# Patient Record
Sex: Male | Born: 1990 | Race: Black or African American | Hispanic: No | Marital: Single
Health system: Midwestern US, Community
[De-identification: ages and names within clinical notes are randomized; demographics above are authoritative.]

## PROBLEM LIST (undated history)

## (undated) DIAGNOSIS — J45909 Unspecified asthma, uncomplicated: Secondary | ICD-10-CM

---

## 2012-12-01 ENCOUNTER — Emergency Department (HOSPITAL_BASED_OUTPATIENT_CLINIC_OR_DEPARTMENT_OTHER)
Admission: EM | Admit: 2012-12-01 | Discharge: 2012-12-01 | Disposition: A | Discharge status: Home / Self Care | Payer: Medicaid Other | Attending: Emergency Medicine | Admitting: Emergency Medicine

## 2012-12-01 ENCOUNTER — Emergency Department (HOSPITAL_BASED_OUTPATIENT_CLINIC_OR_DEPARTMENT_OTHER): Payer: Medicaid Other

## 2012-12-01 ENCOUNTER — Encounter (HOSPITAL_BASED_OUTPATIENT_CLINIC_OR_DEPARTMENT_OTHER): Payer: Self-pay | Admitting: Emergency Medicine

## 2012-12-01 DIAGNOSIS — K219 Gastro-esophageal reflux disease without esophagitis: Secondary | ICD-10-CM | POA: Insufficient documentation

## 2012-12-01 DIAGNOSIS — R110 Nausea: Secondary | ICD-10-CM | POA: Insufficient documentation

## 2012-12-01 DIAGNOSIS — R0789 Other chest pain: Secondary | ICD-10-CM | POA: Insufficient documentation

## 2012-12-01 DIAGNOSIS — R079 Chest pain, unspecified: Secondary | ICD-10-CM

## 2012-12-01 DIAGNOSIS — R0609 Other forms of dyspnea: Secondary | ICD-10-CM | POA: Insufficient documentation

## 2012-12-01 DIAGNOSIS — R0989 Other specified symptoms and signs involving the circulatory and respiratory systems: Secondary | ICD-10-CM | POA: Insufficient documentation

## 2012-12-01 DIAGNOSIS — R1310 Dysphagia, unspecified: Secondary | ICD-10-CM | POA: Insufficient documentation

## 2012-12-01 DIAGNOSIS — R51 Headache: Secondary | ICD-10-CM | POA: Insufficient documentation

## 2012-12-01 MED ORDER — RANITIDINE HCL 150 MG PO CAPS: 150.0000 mg | ORAL_CAPSULE | Freq: Every day | ORAL | Status: AC

## 2012-12-01 NOTE — ED Notes (Signed)
MD at bedside. 

## 2012-12-01 NOTE — ED Provider Notes (Signed)
CSN: [id number]     Arrival date & time 12/01/12  1731 History   This chart was scribed for Ethelda Chick, MD by Joaquin Music, ED Scribe. This patient was seen in room MH07/MH07 and the patient's care was started at [facility] PM  Chief Complaint  Patient presents with  . Chest Pain   Patient is a 22 y.o. male presenting with chest pain. The history is provided by the patient. No language interpreter was used.  Chest Pain Pain location:  L chest Pain quality: aching, throbbing and tightness   Pain radiates to:  Does not radiate Pain radiates to the back: no   Pain severity:  Moderate Onset quality:  Sudden Timing:  Sporadic Progression:  Unchanged Chronicity:  New Relieved by:  Nothing Worsened by:  Nothing tried Ineffective treatments:  None tried Associated symptoms: headache   Associated symptoms: no abdominal pain, no back pain, no cough, no fever, no nausea, no shortness of breath, not vomiting and no weakness   Risk factors: no birth control, no diabetes mellitus, no high cholesterol, no hypertension, not pregnant, no prior DVT/PE and no surgery    HPI Comments: Milton Mcginty is a 22 y.o. male who presents to the Emergency Department complaining of intermittent worsening L sided chest pain with associated HA that began about 2 weeks ago. Pt states he feels "something stuck in his throat" and complicates his breathing and swallowing. He states drinking water improves the pain but states any other fluids do not improve it. He reports having to spit frequently and states the sputum is generally thick. He describes his chest pain as "something really weird". He states he was recently seen at [facility] in [place], [place] and states all labs were WNL. Pt states prior to CP, he was congested and sneezing frequently. Pt denies having a PCP. Pt denies having fevers.  He states he recently moved to [place] 2 weeks ago.  History reviewed. No pertinent past medical  history. History reviewed. No pertinent past surgical history. History reviewed. No pertinent family history. History  Substance Use Topics  . Smoking status: Never Smoker   . Smokeless tobacco: Not on file  . Alcohol Use: No    Review of Systems  Constitutional: Negative for fever.  Respiratory: Negative for cough and shortness of breath.   Cardiovascular: Positive for chest pain.  Gastrointestinal: Negative for nausea, vomiting and abdominal pain.  Musculoskeletal: Negative for back pain.  Neurological: Positive for headaches. Negative for weakness.  All other systems reviewed and are negative.   Allergies  Review of patient's allergies indicates no known allergies.  Home Medications   Current Outpatient Rx  Name  Route  Sig  Dispense  Refill  . ranitidine (ZANTAC) 150 MG capsule   Oral   Take 1 capsule (150 mg total) by mouth daily.   30 capsule   0     Triage Vitals:BP 133/69  Pulse 82  Temp(Src) 98.9 F (37.2 C) (Oral)  Resp 18  Ht 6' (1.829 m)  Wt 160 lb (72.576 kg)  BMI 21.70 kg/m2  SpO2 100%  Physical Exam  Nursing note and vitals reviewed. Constitutional: He is oriented to person, place, and time. He appears well-developed and well-nourished. No distress.  HENT:  Head: Normocephalic and atraumatic.  Eyes: EOM are normal. Pupils are equal, round, and reactive to light.  Neck: Neck supple. No tracheal deviation present.  Cardiovascular: Normal rate, regular rhythm and normal heart sounds.   Pulmonary/Chest: Effort normal and  breath sounds normal. No respiratory distress.  Abdominal: Soft.  Musculoskeletal: Normal range of motion.  Neurological: He is alert and oriented to person, place, and time.  Skin: Skin is warm and dry. He is not diaphoretic.  Psychiatric: He has a normal mood and affect. His behavior is normal.  Note- no reproducible chest wall tenderness  ED Course  Procedures  DIAGNOSTIC STUDIES: Oxygen Saturation is 100% on RA, normal by  my interpretation.    COORDINATION OF CARE: 6:17 PM-Discussed treatment plan which includes EKG and CXR. Pt agreed to plan.   Labs Review Labs Reviewed - No data to display Imaging Review Dg Chest 2 View  12/01/2012   CLINICAL DATA:  Chest pain, difficulty swallowing, difficulty breathing for 2 weeks  EXAM: CHEST  2 VIEW  COMPARISON:  None.  FINDINGS: The heart size and mediastinal contours are within normal limits. Both lungs are clear. The visualized skeletal structures are unremarkable.  IMPRESSION: No active cardiopulmonary disease.   Electronically Signed   By: Esperanza Heir M.D.   On: 12/01/2012 18:44    EKG Interpretation    Date/Time:  Monday December 01 2012 17:42:30 EST Ventricular Rate:  75 PR Interval:  108 QRS Duration: 90 QT Interval:  346 QTC Calculation: 386 R Axis:   77 Text Interpretation:  Sinus rhythm with sinus arrhythmia with short PR Minimal voltage criteria for LVH, may be normal variant Borderline ECG No old tracing to compare Confirmed by [facility]  MD, Avigdor Dollar [id number]) on 12/01/2012 11:23:11 PM            MDM   1. Chest pain   2. GERD (gastroesophageal reflux disease)    Pt presenting with c/o chest discomfort, associated with nausea and a strange feeling with swallowing.  CXR and EKG are reassuring.  Pt has a benign exam, will trial on reflux meds in case this is contributing to his symptoms.  Low suspicion for ACS, low risk for PE, doubt any other acute emergent conditions at this time.  Discharged with strict return precautions.  Pt agreeable with plan.   I personally performed the services described in this documentation, which was scribed in my presence. The recorded information has been reviewed and is accurate.    Ethelda Chick, MD 12/03/12 0111

## 2012-12-01 NOTE — ED Notes (Signed)
Pt has multiple complaints.  Reports a 1-2 week hx of confusion, headaches, nausea, diarrhea, chest pain and generalized not feeling well. Pt A/o x 4.  No distress noted

## 2012-12-14 ENCOUNTER — Emergency Department (HOSPITAL_BASED_OUTPATIENT_CLINIC_OR_DEPARTMENT_OTHER)
Admission: EM | Admit: 2012-12-14 | Discharge: 2012-12-14 | Disposition: A | Discharge status: Home / Self Care | Payer: Medicaid Other | Attending: Emergency Medicine | Admitting: Emergency Medicine

## 2012-12-14 ENCOUNTER — Encounter (HOSPITAL_BASED_OUTPATIENT_CLINIC_OR_DEPARTMENT_OTHER): Payer: Self-pay | Admitting: Emergency Medicine

## 2012-12-14 DIAGNOSIS — F419 Anxiety disorder, unspecified: Secondary | ICD-10-CM

## 2012-12-14 DIAGNOSIS — Z79899 Other long term (current) drug therapy: Secondary | ICD-10-CM | POA: Insufficient documentation

## 2012-12-14 DIAGNOSIS — F411 Generalized anxiety disorder: Secondary | ICD-10-CM | POA: Insufficient documentation

## 2012-12-14 DIAGNOSIS — J45909 Unspecified asthma, uncomplicated: Secondary | ICD-10-CM | POA: Insufficient documentation

## 2012-12-14 HISTORY — DX: Unspecified asthma, uncomplicated: J45.909

## 2012-12-14 NOTE — ED Notes (Addendum)
Pt c/o generalized body aches SOB denies fever n/v/d seen for same in recent past and was dx with anxiety attack and acid reflux

## 2012-12-14 NOTE — ED Provider Notes (Signed)
CSN: [id number]     Arrival date & time 12/14/12  0515 History   First MD Initiated Contact with Patient 12/14/12 [phone]     Chief Complaint  Patient presents with  . Generalized Body Aches   (Consider location/radiation/quality/duration/timing/severity/associated sxs/prior Treatment) Patient is a 22 y.o. male presenting with anxiety. The history is provided by the patient. No language interpreter was used.  Anxiety This is a new problem. The current episode started more than 1 week ago. The problem occurs constantly. The problem has not changed since onset.Pertinent negatives include no chest pain, no abdominal pain and no headaches. Nothing aggravates the symptoms. Nothing relieves the symptoms. He has tried nothing for the symptoms. The treatment provided no relief.  Moved here from [place] to be with family and for a job and doesn't have one.  Has been seen in [place] and diagnosed with anxiety.  Has symptoms in every body system.  No fevers, no leg swelling.  Has dry mouth sweaty palms.  Teeth grinding.  No sore throat.  No cough no abdominal pain.  Past Medical History  Diagnosis Date  . Asthma    History reviewed. No pertinent past surgical history. History reviewed. No pertinent family history. History  Substance Use Topics  . Smoking status: Never Smoker   . Smokeless tobacco: Not on file  . Alcohol Use: No    Review of Systems  Constitutional: Negative for fever.  HENT: Negative for drooling.   Respiratory: Negative for cough.   Cardiovascular: Negative for chest pain, palpitations and leg swelling.  Gastrointestinal: Negative for abdominal pain.  Neurological: Negative for headaches.  All other systems reviewed and are negative.    Allergies  Review of patient's allergies indicates no known allergies.  Home Medications   Current Outpatient Rx  Name  Route  Sig  Dispense  Refill  . ranitidine (ZANTAC) 150 MG capsule   Oral   Take 1 capsule (150 mg total) by  mouth daily.   30 capsule   0    BP 135/85  Pulse 69  Temp(Src) 98.3 F (36.8 C) (Oral)  Resp 18  Ht 5\' 11"  (1.803 m)  Wt 160 lb (72.576 kg)  BMI 22.33 kg/m2  SpO2 99% Physical Exam  Constitutional: He is oriented to person, place, and time. He appears well-developed and well-nourished. No distress.  Wringing hands grinding teeth  HENT:  Head: Normocephalic and atraumatic.  Mouth/Throat: Oropharynx is clear and moist. No oropharyngeal exudate.  Teeth with grinding pattern  Eyes: Conjunctivae are normal. Pupils are equal, round, and reactive to light.  Neck: Normal range of motion. Neck supple.  Cardiovascular: Normal rate, regular rhythm and intact distal pulses.   Pulmonary/Chest: Effort normal and breath sounds normal. No stridor. He has no wheezes. He has no rales.  Abdominal: Soft. Bowel sounds are normal. There is no tenderness. There is no rebound and no guarding.  Musculoskeletal: Normal range of motion.  Neurological: He is alert and oriented to person, place, and time. He has normal reflexes.  Skin: Skin is warm and dry.  Psychiatric: His mood appears anxious.    ED Course  Procedures (including critical care time) Labs Review Labs Reviewed - No data to display Imaging Review No results found.  EKG Interpretation   None       MDM  Wells 0, doubt pulmonary etiology.  1. Anxiety   When told exam is normal.  When told exam is normal then wants to know if he has a dental  infection that is causing him to be systemically ill.   Will give community resources, not suicidal but has not insight to the fact that he is anxious and the anxiety is manifesting as physical symptoms.      Jasmine Awe, MD 12/14/12 [phone]

## 2012-12-14 NOTE — ED Notes (Signed)
Patient gave urine sample. 

## 2012-12-14 NOTE — ED Notes (Signed)
Patient complains of all-over body aches. Patient states he was in a week or two ago for same symptoms with no relief.

## 2014-05-28 IMAGING — CR DG CHEST 2V
2 series · 2 of 2 positions shown · non-contrast
Comparison: None.

CLINICAL DATA: Chest pain, difficulty swallowing, difficulty
breathing for 2 weeks

EXAM:
CHEST  2 VIEW

[w chest pa]
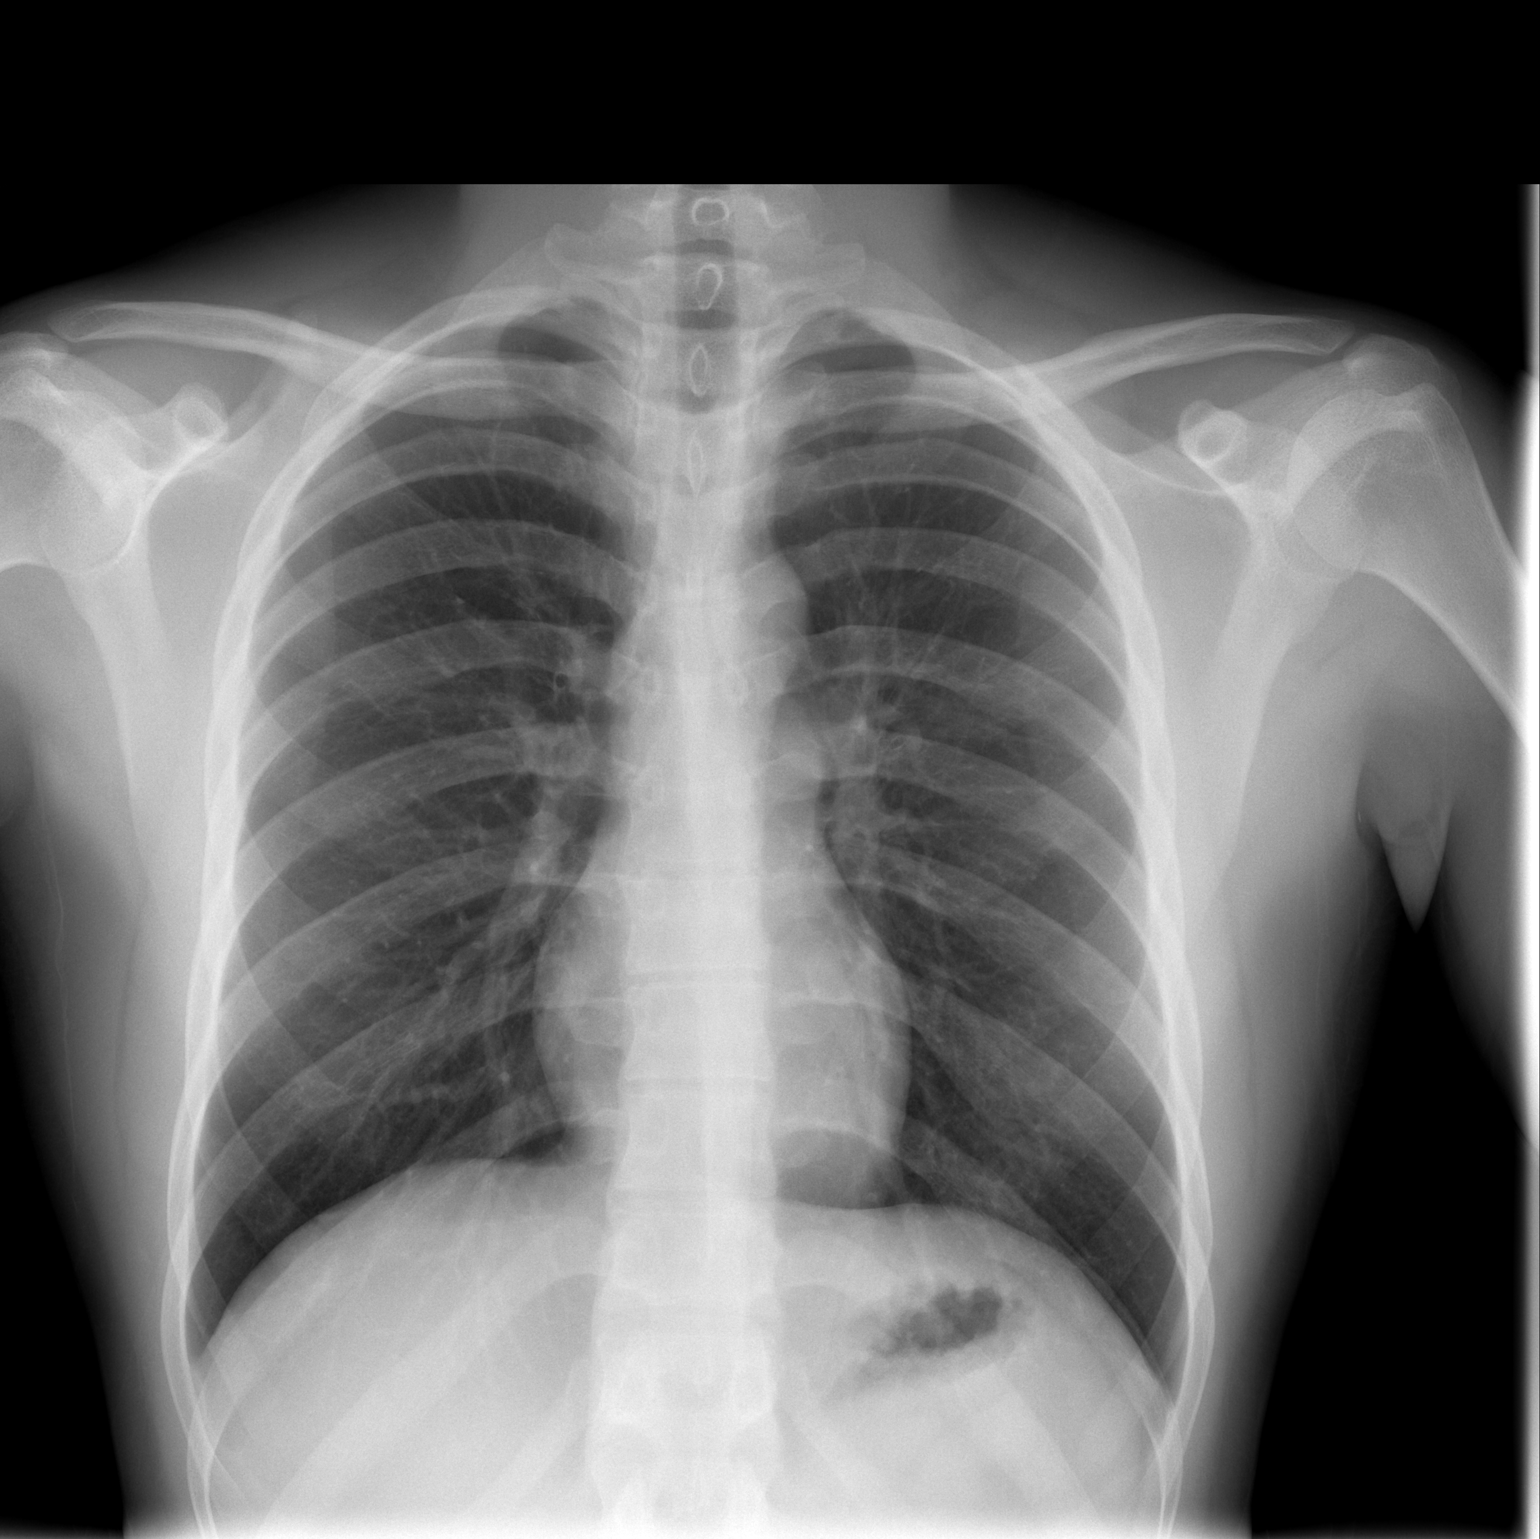

[w chest lat]
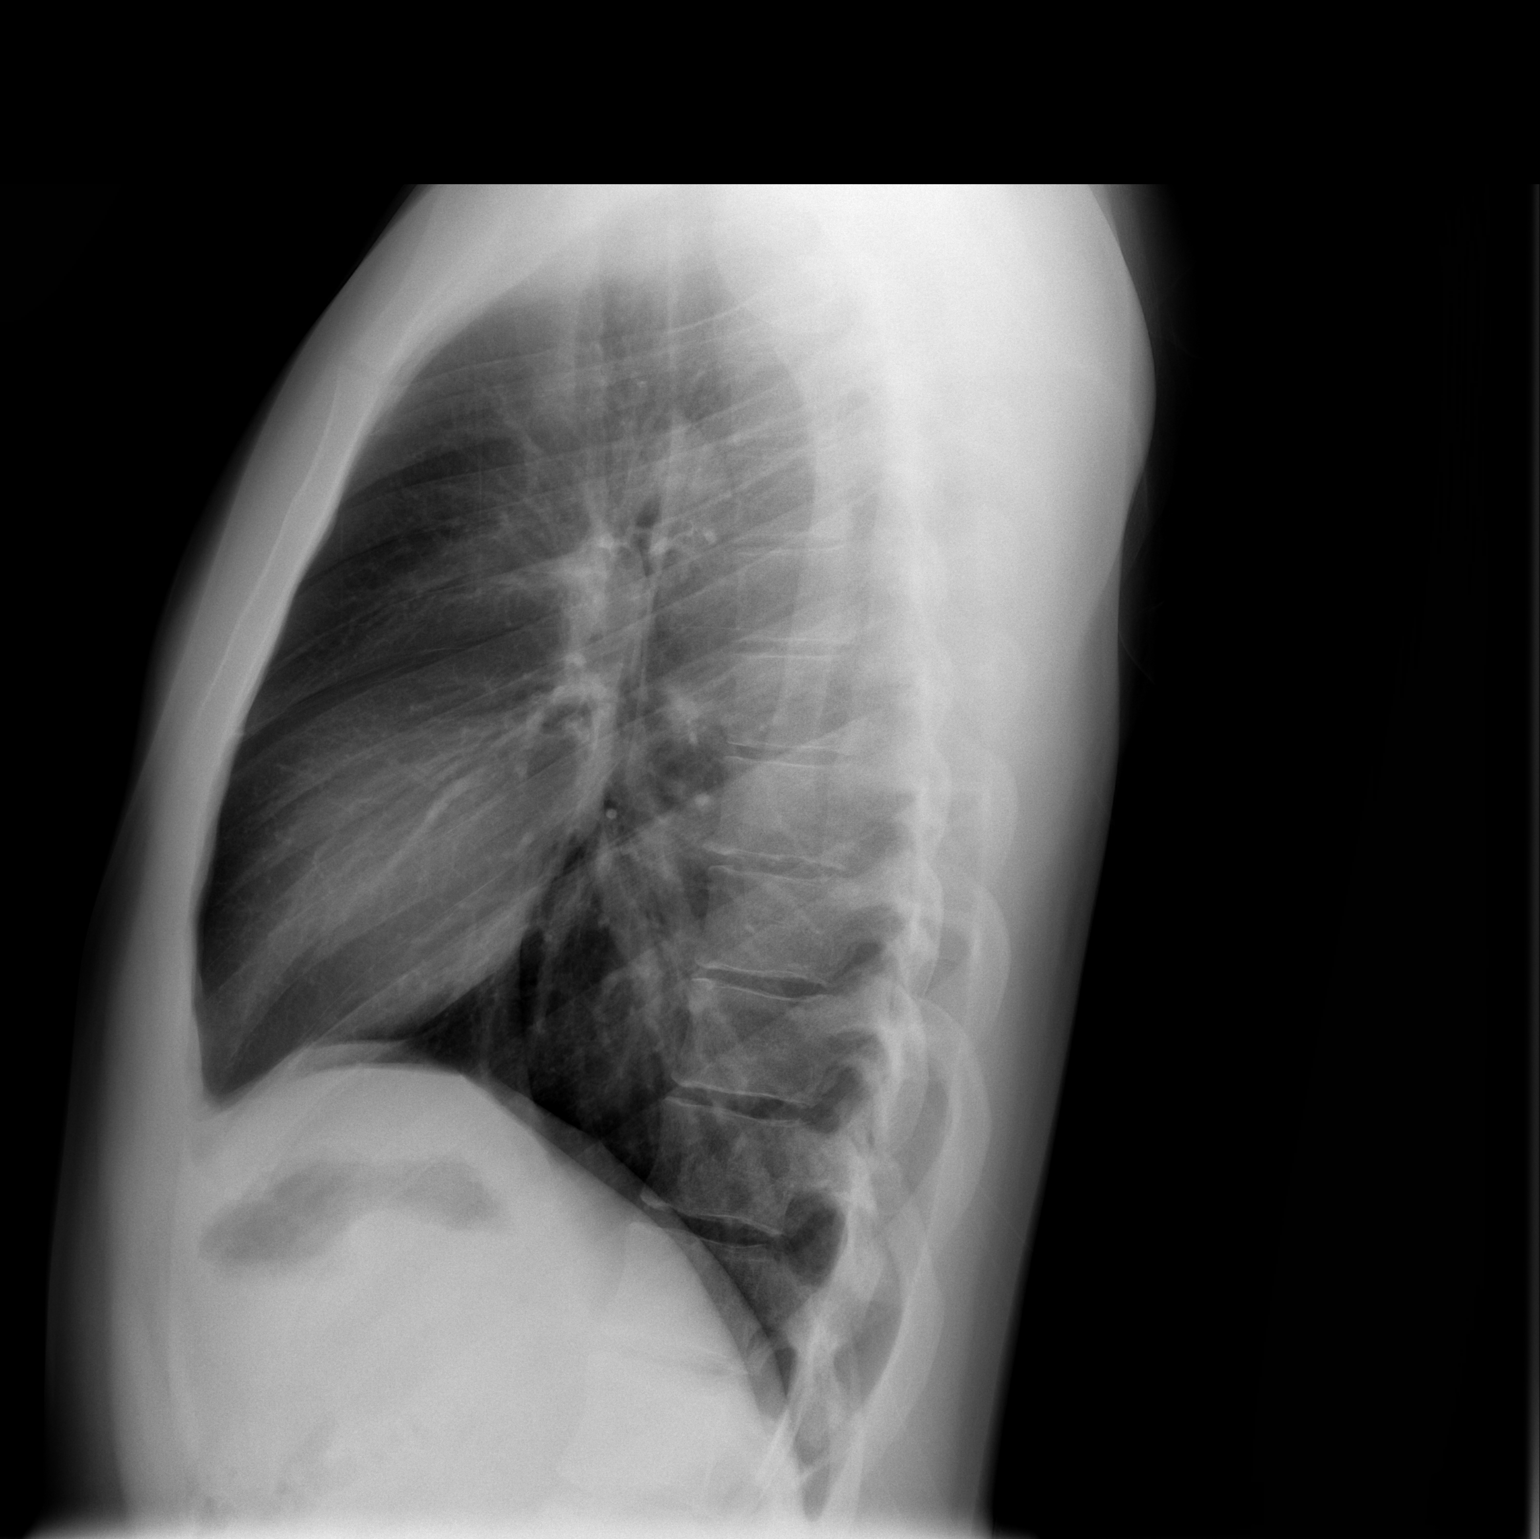

[2 of 2 positions shown; findings below may reference images not displayed]

FINDINGS: The heart size and mediastinal contours are within normal limits.
Both lungs are clear. The visualized skeletal structures are
unremarkable.
IMPRESSION: No active cardiopulmonary disease.

## 2014-12-10 ENCOUNTER — Inpatient Hospital Stay
Admit: 2014-12-10 | Discharge: 2014-12-10 | Disposition: A | Discharge status: Home / Self Care | Payer: Self-pay | Attending: Emergency Medicine

## 2014-12-10 DIAGNOSIS — S71131A Puncture wound without foreign body, right thigh, initial encounter: Secondary | ICD-10-CM

## 2014-12-10 MED ORDER — HYDROCODONE-ACETAMINOPHEN 5 MG-325 MG TAB
5-325 mg | ORAL_TABLET | ORAL | 0 refills | Status: DC | PRN
Start: 2014-12-10 — End: 2015-01-12
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
## 2014-12-10 NOTE — ED Notes (Signed)
I have reviewed discharge instructions with the patient.  The patient verbalized understanding.  Patient armband removed and shredded

## 2014-12-10 NOTE — ED Notes (Signed)
Pa Carl Wright at bedside to evaluate pt.

## 2014-12-10 NOTE — ED Notes (Signed)
Bulky dressing was applied by provider.
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
## 2015-01-12 ENCOUNTER — Inpatient Hospital Stay
Admit: 2015-01-12 | Discharge: 2015-01-12 | Disposition: A | Discharge status: Home / Self Care | Payer: Self-pay | Attending: Emergency Medicine

## 2015-01-12 DIAGNOSIS — Z4801 Encounter for change or removal of surgical wound dressing: Secondary | ICD-10-CM

## 2015-01-12 MED ORDER — TRIMETHOPRIM-SULFAMETHOXAZOLE 160 MG-800 MG TAB
160-800 mg | ORAL_TABLET | Freq: Two times a day (BID) | ORAL | 0 refills | Status: AC
Start: 2015-01-12 — End: 2015-01-19

## 2015-01-12 MED ORDER — CEPHALEXIN 500 MG CAP
500 mg | ORAL_CAPSULE | Freq: Four times a day (QID) | ORAL | 0 refills | Status: AC
Start: 2015-01-12 — End: 2015-01-19

## 2015-01-12 NOTE — ED Notes (Signed)
I have reviewed discharge instructions with the patient.  The patient verbalized understanding. Patient armband removed and shredded
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
## 2015-01-12 NOTE — ED Triage Notes (Signed)
Seen here last month for abscess right calf. Did not fill medications after discharge due to no money. Area remains open, patient packed it last night. Indurated area noted around wound.
